# Patient Record
Sex: Male | Born: 1950
Health system: Southern US, Community
[De-identification: ages and names within clinical notes are randomized; demographics above are authoritative.]

## PROBLEM LIST (undated history)

## (undated) DIAGNOSIS — I10 Essential (primary) hypertension: Secondary | ICD-10-CM

---

## 2016-02-14 ENCOUNTER — Encounter (HOSPITAL_BASED_OUTPATIENT_CLINIC_OR_DEPARTMENT_OTHER): Payer: Self-pay | Admitting: *Deleted

## 2016-02-14 ENCOUNTER — Emergency Department (HOSPITAL_BASED_OUTPATIENT_CLINIC_OR_DEPARTMENT_OTHER)
Admission: EM | Admit: 2016-02-14 | Discharge: 2016-02-14 | Disposition: A | Discharge status: Home / Self Care | Payer: Medicare HMO | Attending: Emergency Medicine | Admitting: Emergency Medicine

## 2016-02-14 ENCOUNTER — Emergency Department (HOSPITAL_BASED_OUTPATIENT_CLINIC_OR_DEPARTMENT_OTHER): Payer: Medicare HMO

## 2016-02-14 DIAGNOSIS — Z79899 Other long term (current) drug therapy: Secondary | ICD-10-CM | POA: Insufficient documentation

## 2016-02-14 DIAGNOSIS — N390 Urinary tract infection, site not specified: Secondary | ICD-10-CM | POA: Diagnosis not present

## 2016-02-14 DIAGNOSIS — I10 Essential (primary) hypertension: Secondary | ICD-10-CM | POA: Insufficient documentation

## 2016-02-14 DIAGNOSIS — R300 Dysuria: Secondary | ICD-10-CM | POA: Diagnosis present

## 2016-02-14 HISTORY — DX: Essential (primary) hypertension: I10

## 2016-02-14 LAB — CBC WITH DIFFERENTIAL/PLATELET
BASOS ABS: 0 10*3/uL (ref 0.0–0.1)
Basophils Relative: 0 %
EOS ABS: 0 10*3/uL (ref 0.0–0.7)
Eosinophils Relative: 0 %
HEMATOCRIT: 39.9 % (ref 39.0–52.0)
Hemoglobin: 13.3 g/dL (ref 13.0–17.0)
LYMPHS ABS: 0.3 10*3/uL — AB (ref 0.7–4.0)
Lymphocytes Relative: 3 %
MCH: 31.8 pg (ref 26.0–34.0)
MCHC: 33.3 g/dL (ref 30.0–36.0)
MCV: 95.5 fL (ref 78.0–100.0)
MONO ABS: 0.6 10*3/uL (ref 0.1–1.0)
MONOS PCT: 6 %
NEUTROS ABS: 9.8 10*3/uL — AB (ref 1.7–7.7)
Neutrophils Relative %: 91 %
PLATELETS: 105 10*3/uL — AB (ref 150–400)
RBC: 4.18 MIL/uL — AB (ref 4.22–5.81)
RDW: 12.6 % (ref 11.5–15.5)
WBC: 10.7 10*3/uL — AB (ref 4.0–10.5)

## 2016-02-14 LAB — URINE MICROSCOPIC-ADD ON

## 2016-02-14 LAB — URINALYSIS, ROUTINE W REFLEX MICROSCOPIC
BILIRUBIN URINE: NEGATIVE
Glucose, UA: NEGATIVE mg/dL
KETONES UR: 15 mg/dL — AB
NITRITE: POSITIVE — AB
PROTEIN: 100 mg/dL — AB
SPECIFIC GRAVITY, URINE: 1.024 (ref 1.005–1.030)
pH: 7.5 (ref 5.0–8.0)

## 2016-02-14 LAB — COMPREHENSIVE METABOLIC PANEL
ALBUMIN: 3.8 g/dL (ref 3.5–5.0)
ALT: 19 U/L (ref 17–63)
ANION GAP: 8 (ref 5–15)
AST: 26 U/L (ref 15–41)
Alkaline Phosphatase: 48 U/L (ref 38–126)
BILIRUBIN TOTAL: 1.6 mg/dL — AB (ref 0.3–1.2)
BUN: 18 mg/dL (ref 6–20)
CHLORIDE: 105 mmol/L (ref 101–111)
CO2: 25 mmol/L (ref 22–32)
Calcium: 8.5 mg/dL — ABNORMAL LOW (ref 8.9–10.3)
Creatinine, Ser: 1.14 mg/dL (ref 0.61–1.24)
GFR calc Af Amer: >60 mL/min (ref >60)
GFR calc non Af Amer: >60 mL/min (ref >60)
GLUCOSE: 126 mg/dL — AB (ref 65–99)
POTASSIUM: 3.8 mmol/L (ref 3.5–5.1)
Sodium: 138 mmol/L (ref 135–145)
TOTAL PROTEIN: 7 g/dL (ref 6.5–8.1)

## 2016-02-14 LAB — I-STAT CG4 LACTIC ACID, ED: Lactic Acid, Venous: 0.99 mmol/L (ref 0.5–2.0)

## 2016-02-14 LAB — LIPASE, BLOOD: LIPASE: 19 U/L (ref 11–51)

## 2016-02-14 MED ORDER — ACETAMINOPHEN 500 MG PO TABS
1000.0000 mg | ORAL_TABLET | Freq: Once | ORAL | Status: AC
  Administered 2016-02-14: 1000 mg via ORAL
  Filled 2016-02-14: qty 2

## 2016-02-14 MED ORDER — HYDROMORPHONE HCL 1 MG/ML IJ SOLN
1.0000 mg | Freq: Once | INTRAMUSCULAR | Status: AC
Start: 2016-02-14 — End: 2016-02-14
  Administered 2016-02-14: 1 mg via INTRAVENOUS
  Filled 2016-02-14: qty 1

## 2016-02-14 MED ORDER — CEPHALEXIN 500 MG PO CAPS: 500.0000 mg | ORAL_CAPSULE | Freq: Four times a day (QID) | ORAL | Status: AC

## 2016-02-14 MED ORDER — PROMETHAZINE HCL 25 MG PO TABS: 25.0000 mg | ORAL_TABLET | Freq: Four times a day (QID) | ORAL | Status: AC | PRN

## 2016-02-14 MED ORDER — ONDANSETRON HCL 4 MG/2ML IJ SOLN
4.0000 mg | Freq: Once | INTRAMUSCULAR | Status: AC
  Administered 2016-02-14: 4 mg via INTRAVENOUS
  Filled 2016-02-14: qty 2

## 2016-02-14 MED ORDER — HYDROCODONE-ACETAMINOPHEN 5-325 MG PO TABS: 2.0000 | ORAL_TABLET | ORAL | Status: AC | PRN

## 2016-02-14 MED ORDER — DEXTROSE 5 % IV SOLN
1.0000 g | Freq: Once | INTRAVENOUS | Status: AC
  Administered 2016-02-14: 1 g via INTRAVENOUS
  Filled 2016-02-14: qty 10

## 2016-02-14 MED ORDER — SODIUM CHLORIDE 0.9 % IV BOLUS (SEPSIS)
1000.0000 mL | Freq: Once | INTRAVENOUS | Status: AC
Start: 2016-02-14 — End: 2016-02-14
  Administered 2016-02-14: 1000 mL via INTRAVENOUS

## 2016-02-14 MED FILL — PROMETHAZINE 25 MG TABLET: 25 | 5 days supply | Qty: 20 | Fill #0

## 2016-02-14 MED FILL — CEPHALEXIN 500 MG CAPSULE: 500 | 7 days supply | Qty: 28 | Fill #0

## 2016-02-14 MED FILL — HYDROCODON-APAP 5-325: 5-325 | 1 days supply | Qty: 10 | Fill #0

## 2016-02-14 NOTE — Discharge Instructions (Signed)

## 2016-02-14 NOTE — ED Notes (Signed)
C/o n/v and fever since yesterday. Low back pain after vomiting started.

## 2016-02-14 NOTE — ED Provider Notes (Signed)
CSN: [id number]     Arrival date & time 02/14/16  1056 History   First MD Initiated Contact with Patient 02/14/16 1105     No chief complaint on file.     HPI C/o n/v and fever since yesterday. Low back pain after vomiting started Past Medical History  Diagnosis Date  . Hypertension    History reviewed. No pertinent past surgical history. No family history on file. Social History  Substance Use Topics  . Smoking status: Never Smoker   . Smokeless tobacco: None  . Alcohol Use: None    Review of Systems  Constitutional: Positive for fever and chills.  Genitourinary: Positive for dysuria. Negative for flank pain.  All other systems reviewed and are negative.     Allergies  Review of patient's allergies indicates no known allergies.  Home Medications   Prior to Admission medications   Medication Sig Start Date End Date Taking? Authorizing Provider  cephALEXin (KEFLEX) 500 MG capsule Take 1 capsule (500 mg total) by mouth 4 (four) times daily. 02/14/16   Nelva Nay, MD  HYDROcodone-acetaminophen (NORCO/VICODIN) 5-325 MG tablet Take 2 tablets by mouth every 4 (four) hours as needed. 02/14/16   Nelva Nay, MD  losartan (COZAAR) 100 MG tablet Take 100 mg by mouth daily.   Yes Historical Provider, MD  promethazine (PHENERGAN) 25 MG tablet Take 1 tablet (25 mg total) by mouth every 6 (six) hours as needed for nausea or vomiting. 02/14/16   Nelva Nay, MD   BP 120/61 mmHg  Pulse 102  Temp(Src) 99.6 F (37.6 C) (Oral)  Resp 20  Ht 5\' 7"  (1.702 m)  Wt 178 lb (80.74 kg)  BMI 27.87 kg/m2  SpO2 94% Physical Exam  Constitutional: He is oriented to person, place, and time. He appears well-developed and well-nourished. No distress.  HENT:  Head: Normocephalic and atraumatic.  Eyes: Pupils are equal, round, and reactive to light.  Neck: Normal range of motion.  Cardiovascular: Normal rate and intact distal pulses.   Pulmonary/Chest: No respiratory distress.  Abdominal:  Soft. Normal appearance and bowel sounds are normal. He exhibits no distension. There is no tenderness. There is no rebound and no guarding.  Musculoskeletal: Normal range of motion.       Back:  Neurological: He is alert and oriented to person, place, and time. No cranial nerve deficit.  Skin: Skin is warm and dry. No rash noted.  Psychiatric: He has a normal mood and affect. His behavior is normal.  Nursing note and vitals reviewed.   ED Course  Procedures (including critical care time) Labs Review  Results for orders placed or performed during the hospital encounter of 02/14/16  Culture, blood (Routine X 2) w Reflex to ID Panel  Result Value Ref Range   Specimen Description BLOOD LEFT AC    Special Requests BOTTLES DRAWN AEROBIC AND ANAEROBIC 5CC EACH    Culture      NO GROWTH 5 DAYS Performed at [facility]    Report Status 02/19/2016 FINAL   Culture, blood (Routine X 2) w Reflex to ID Panel  Result Value Ref Range   Specimen Description BLOOD RIGHT FOREARM    Special Requests BOTTLES DRAWN AEROBIC ONLY 3CC    Culture      NO GROWTH 5 DAYS Performed at [facility]    Report Status 02/19/2016 FINAL   Urinalysis, Routine w reflex microscopic (not at [facility])  Result Value Ref Range   Color, Urine YELLOW YELLOW  APPearance CLOUDY (A) CLEAR   Specific Gravity, Urine 1.024 1.005 - 1.030   pH 7.5 5.0 - 8.0   Glucose, UA NEGATIVE NEGATIVE mg/dL   Hgb urine dipstick TRACE (A) NEGATIVE   Bilirubin Urine NEGATIVE NEGATIVE   Ketones, ur 15 (A) NEGATIVE mg/dL   Protein, ur 161 (A) NEGATIVE mg/dL   Nitrite POSITIVE (A) NEGATIVE   Leukocytes, UA MODERATE (A) NEGATIVE  CBC with Differential/Platelet  Result Value Ref Range   WBC 10.7 (H) 4.0 - 10.5 K/uL   RBC 4.18 (L) 4.22 - 5.81 MIL/uL   Hemoglobin 13.3 13.0 - 17.0 g/dL   HCT 09.6 04.5 - 40.9 %   MCV 95.5 78.0 - 100.0 fL   MCH 31.8 26.0 - 34.0 pg   MCHC 33.3 30.0 - 36.0 g/dL   RDW 81.1 91.4 - 78.2 %    Platelets 105 (L) 150 - 400 K/uL   Neutrophils Relative % 91 %   Lymphocytes Relative 3 %   Monocytes Relative 6 %   Eosinophils Relative 0 %   Basophils Relative 0 %   Neutro Abs 9.8 (H) 1.7 - 7.7 K/uL   Lymphs Abs 0.3 (L) 0.7 - 4.0 K/uL   Monocytes Absolute 0.6 0.1 - 1.0 K/uL   Eosinophils Absolute 0.0 0.0 - 0.7 K/uL   Basophils Absolute 0.0 0.0 - 0.1 K/uL   Smear Review PLATELET COUNT CONFIRMED BY SMEAR   Comprehensive metabolic panel  Result Value Ref Range   Sodium 138 135 - 145 mmol/L   Potassium 3.8 3.5 - 5.1 mmol/L   Chloride 105 101 - 111 mmol/L   CO2 25 22 - 32 mmol/L   Glucose, Bld 126 (H) 65 - 99 mg/dL   BUN 18 6 - 20 mg/dL   Creatinine, Ser 9.56 0.61 - 1.24 mg/dL   Calcium 8.5 (L) 8.9 - 10.3 mg/dL   Total Protein 7.0 6.5 - 8.1 g/dL   Albumin 3.8 3.5 - 5.0 g/dL   AST 26 15 - 41 U/L   ALT 19 17 - 63 U/L   Alkaline Phosphatase 48 38 - 126 U/L   Total Bilirubin 1.6 (H) 0.3 - 1.2 mg/dL   GFR calc non Af Amer >60 >60 mL/min   GFR calc Af Amer >60 >60 mL/min   Anion gap 8 5 - 15  Lipase, blood  Result Value Ref Range   Lipase 19 11 - 51 U/L  Urine microscopic-add on  Result Value Ref Range   Squamous Epithelial / LPF 0-5 (A) NONE SEEN   WBC, UA 6-30 0 - 5 WBC/hpf   RBC / HPF 0-5 0 - 5 RBC/hpf   Bacteria, UA FEW (A) NONE SEEN   Urine-Other MUCOUS PRESENT   I-Stat CG4 Lactic Acid, ED  Result Value Ref Range   Lactic Acid, Venous 0.99 0.5 - 2.0 mmol/L   Dg Abd Acute W/chest  02/14/2016  CLINICAL DATA:  Fever and chills with vomiting for 1 day EXAM: DG ABDOMEN ACUTE W/ 1V CHEST COMPARISON:  None. FINDINGS: Cardiac shadow is within normal limits. The lungs are clear bilaterally. Scattered large and small bowel gas is noted. No abnormal mass or abnormal calcifications are seen. Postsurgical changes are noted in the right inguinal region. No acute bony abnormality is seen. IMPRESSION: No acute abnormality noted. Electronically Signed   By: Alcide Clever M.D.   On:  02/14/2016 11:41        MDM   Final diagnoses:  UTI (lower urinary tract infection)  Nelva Nay, MD 03/08/16 [phone]

## 2016-02-19 LAB — CULTURE, BLOOD (ROUTINE X 2)
CULTURE: NO GROWTH
Culture: NO GROWTH

## 2017-09-24 IMAGING — CR DG ABDOMEN ACUTE W/ 1V CHEST
3 series · 3 of 3 positions shown · non-contrast
Comparison: None.

CLINICAL DATA: Fever and chills with vomiting for 1 day

EXAM:
DG ABDOMEN ACUTE W/ 1V CHEST

[w chest pa]
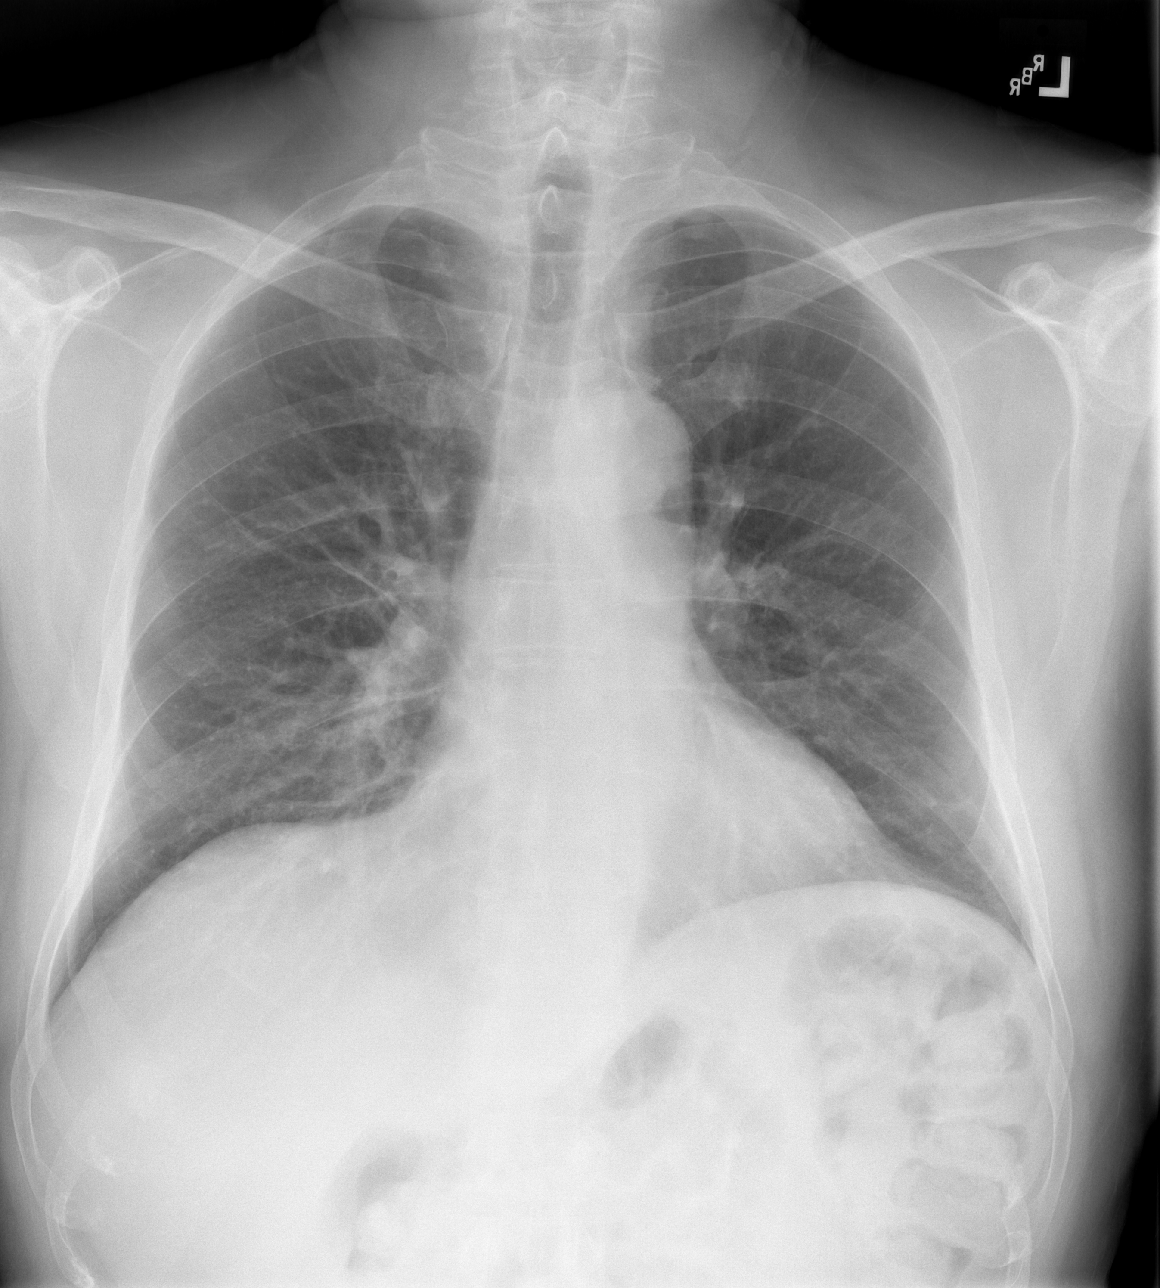

[w abdomen upright]
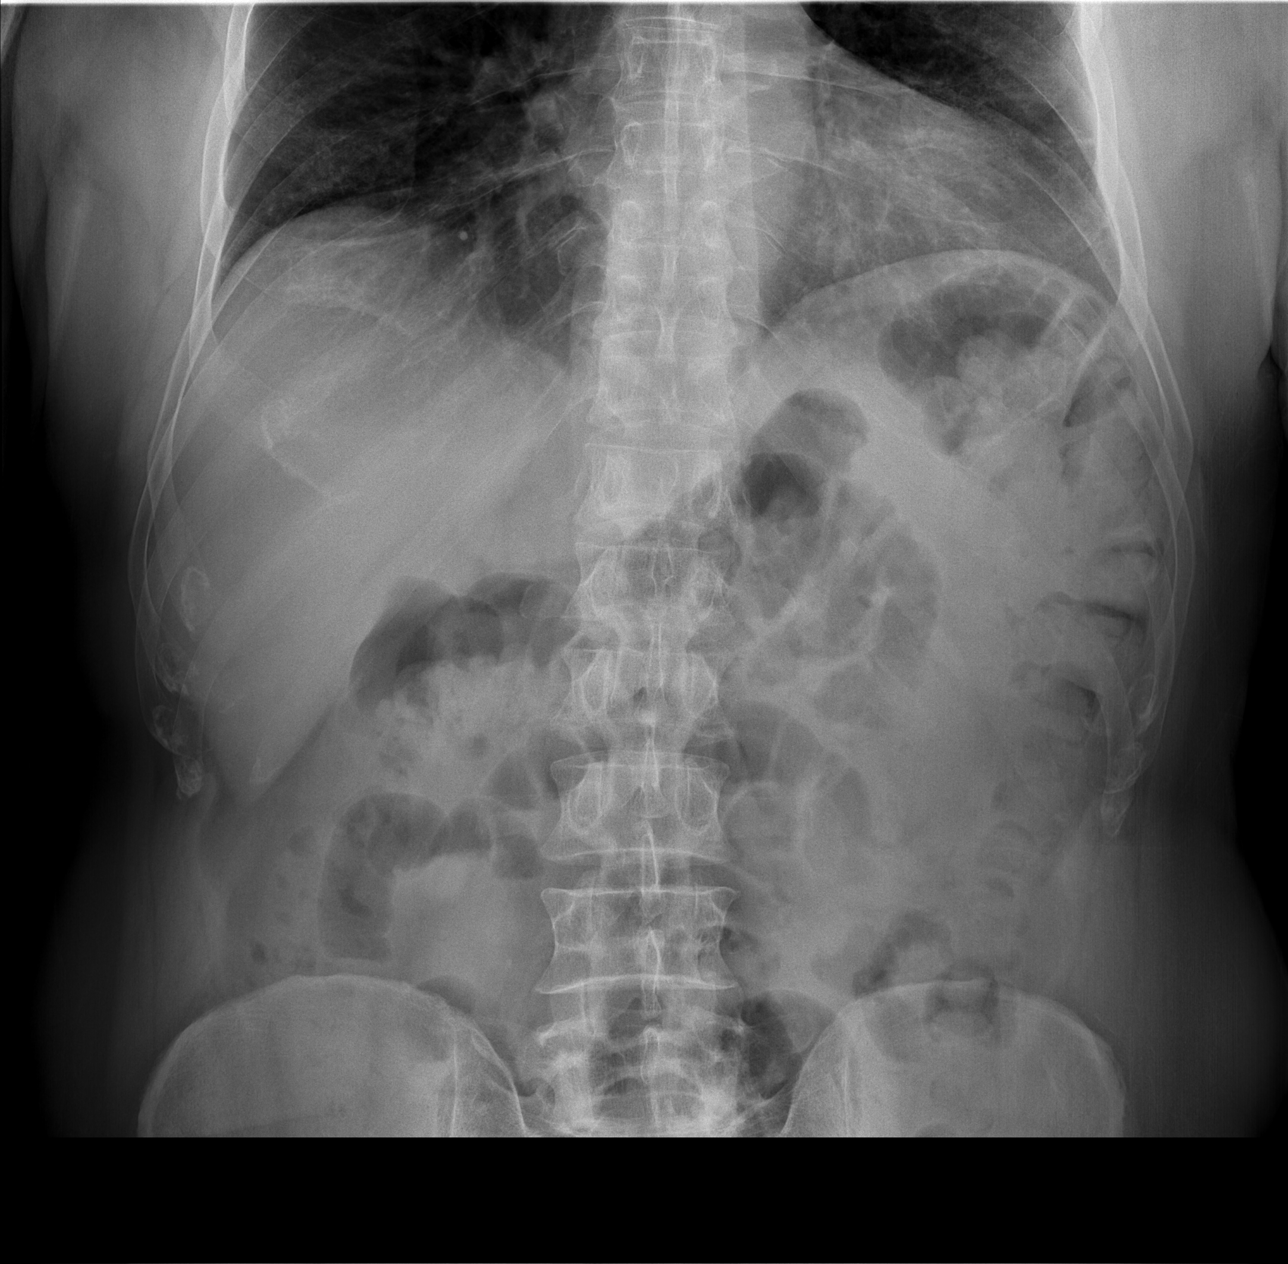

[t abdomen supine]
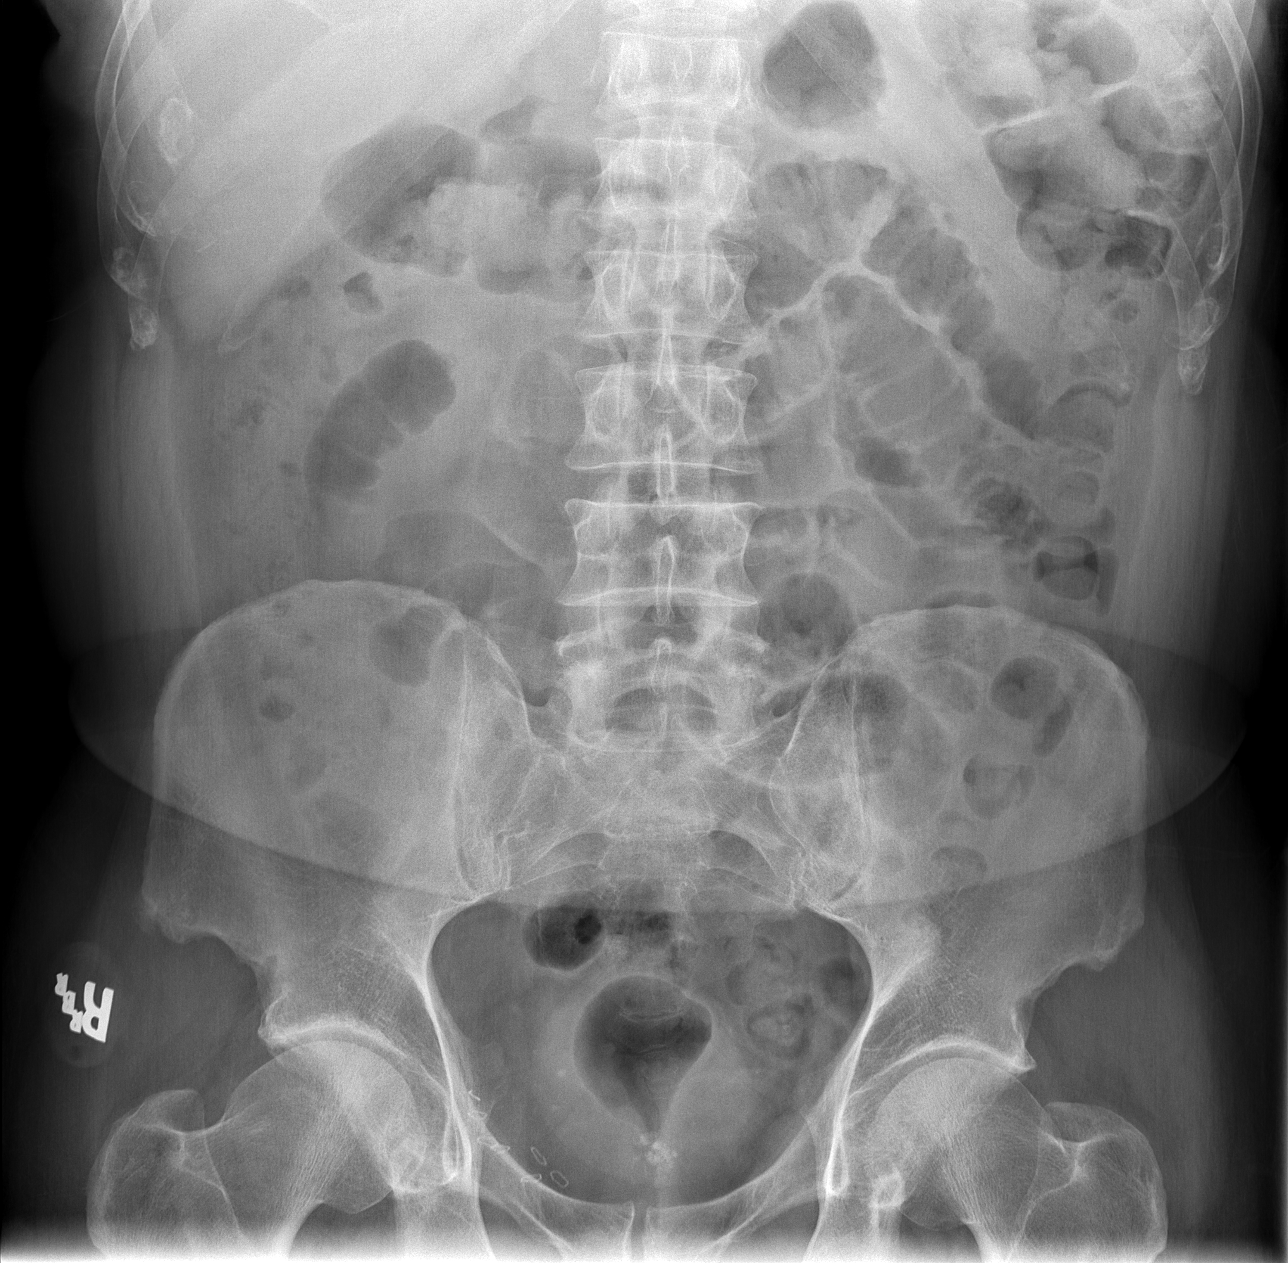

[3 of 3 positions shown; findings below may reference images not displayed]

FINDINGS: Cardiac shadow is within normal limits. The lungs are clear
bilaterally.

Scattered large and small bowel gas is noted. No abnormal mass or
abnormal calcifications are seen. Postsurgical changes are noted in
the right inguinal region. No acute bony abnormality is seen.
IMPRESSION: No acute abnormality noted.
